# Patient Record
Sex: Male | Born: 1970
Health system: Southern US, Community
[De-identification: ages and names within clinical notes are randomized; demographics above are authoritative.]

## PROBLEM LIST (undated history)

## (undated) DIAGNOSIS — F32A Depression, unspecified: Secondary | ICD-10-CM

## (undated) DIAGNOSIS — B182 Chronic viral hepatitis C: Secondary | ICD-10-CM

## (undated) HISTORY — PX: BACK SURGERY: SHX140

## (undated) HISTORY — PX: BONY PELVIS SURGERY: SHX572

---

## 2010-06-10 ENCOUNTER — Emergency Department: Payer: Self-pay | Admitting: Emergency Medicine

## 2010-07-27 ENCOUNTER — Emergency Department: Payer: Self-pay | Admitting: Emergency Medicine

## 2017-10-08 ENCOUNTER — Ambulatory Visit
Admission: EM | Admit: 2017-10-08 | Discharge: 2017-10-08 | Disposition: A | Discharge status: Home / Self Care | Payer: Medicaid Other | Attending: Internal Medicine | Admitting: Internal Medicine

## 2017-10-08 ENCOUNTER — Other Ambulatory Visit: Payer: Self-pay

## 2017-10-08 ENCOUNTER — Ambulatory Visit: Payer: Medicaid Other

## 2017-10-08 ENCOUNTER — Encounter: Payer: Self-pay | Admitting: Gynecology

## 2017-10-08 DIAGNOSIS — S29011A Strain of muscle and tendon of front wall of thorax, initial encounter: Secondary | ICD-10-CM | POA: Insufficient documentation

## 2017-10-08 DIAGNOSIS — F17210 Nicotine dependence, cigarettes, uncomplicated: Secondary | ICD-10-CM | POA: Diagnosis not present

## 2017-10-08 DIAGNOSIS — Z8249 Family history of ischemic heart disease and other diseases of the circulatory system: Secondary | ICD-10-CM | POA: Diagnosis not present

## 2017-10-08 DIAGNOSIS — W51XXXA Accidental striking against or bumped into by another person, initial encounter: Secondary | ICD-10-CM | POA: Insufficient documentation

## 2017-10-08 DIAGNOSIS — Z881 Allergy status to other antibiotic agents status: Secondary | ICD-10-CM | POA: Diagnosis not present

## 2017-10-08 DIAGNOSIS — R0781 Pleurodynia: Secondary | ICD-10-CM | POA: Diagnosis present

## 2017-10-08 MED ORDER — SIMETHICONE 80 MG PO CHEW: 80.0000 mg | CHEWABLE_TABLET | Freq: Four times a day (QID) | ORAL | 0 refills | Status: DC | PRN

## 2017-10-08 MED ORDER — CYCLOBENZAPRINE HCL 10 MG PO TABS: 10.0000 mg | ORAL_TABLET | Freq: Two times a day (BID) | ORAL | 0 refills | Status: DC | PRN

## 2017-10-08 NOTE — ED Triage Notes (Signed)
Per patient was playing  With his son x yesterday when his son feet hit hm on his left side rib area.

## 2017-10-08 NOTE — ED Provider Notes (Signed)
[facility]    CSN: [id number] Arrival date & time: 10/08/17  4098     History   Chief Complaint Chief Complaint  Patient presents with  . Rib Injury    HPI KAIZEN IBSEN is a 47 y.o. male.   47 year old male who looks older than his stated age with history of chronic pain presents to urgent care complaining of left rib pain.  The patient states that his son jumped onto his chest yesterday and landed on his posterior chest lateral to his shoulder blade.  The patient felt exquisite pain immediately and states that it has been tough to move his arm and to cough due to pain since the injury.  He denies shortness of breath, nausea, vomiting or palpitations.  He states the pain radiates into his posterior neck.     History reviewed. No pertinent past medical history.  There are no active problems to display for this patient.   Past Surgical History:  Procedure Laterality Date  . BONY PELVIS SURGERY         Home Medications    Prior to Admission medications   Medication Sig Start Date End Date Taking? Authorizing Provider  acetaminophen (TYLENOL) 325 MG tablet Take by mouth.    [provider]  cyclobenzaprine (FLEXERIL) 10 MG tablet Take 1 tablet (10 mg total) by mouth 3 times/day as needed-between meals & bedtime for muscle spasms. 10/08/17   Arnaldo Natal, MD  simethicone (GAS-X) 80 MG chewable tablet Chew 1 tablet (80 mg total) by mouth every 6 (six) hours as needed for flatulence. 10/08/17   Arnaldo Natal, MD    Family History Family History  Problem Relation Age of Onset  . Hypertension Mother     Social History Social History   Tobacco Use  . Smoking status: Current Some Day Smoker    Packs/day: 1.00    Types: Cigarettes  . Smokeless tobacco: Never Used  Substance Use Topics  . Alcohol use: Never    Frequency: Never  . Drug use: Never     Allergies   Vancomycin   Review of Systems Review of Systems    Constitutional: Negative for chills and fever.  HENT: Negative for sore throat and tinnitus.   Eyes: Negative for redness.  Respiratory: Negative for cough and shortness of breath.   Cardiovascular: Positive for chest pain. Negative for palpitations.  Gastrointestinal: Negative for abdominal pain, diarrhea, nausea and vomiting.  Genitourinary: Negative for dysuria, frequency and urgency.  Musculoskeletal: Negative for myalgias.  Skin: Negative for rash.       No lesions  Neurological: Negative for weakness.  Hematological: Does not bruise/bleed easily.  Psychiatric/Behavioral: Negative for suicidal ideas.     Physical Exam Triage Vital Signs ED Triage Vitals  Enc Vitals Group     BP --      Pulse --      Resp --      Temp --      Temp src --      SpO2 --      Weight 10/08/17 0948 140 lb (63.5 kg)     Height 10/08/17 0948 5\' 10"  (1.778 m)     Head Circumference --      Peak Flow --      Pain Score 10/08/17 1134 0     Pain Loc --      Pain Edu? --      Excl. in GC? --    No data found.  Updated Vital Signs Ht 5\' 10"  (1.778 m)   Wt 140 lb (63.5 kg)   BMI 20.09 kg/m   Visual Acuity Right Eye Distance:   Left Eye Distance:   Bilateral Distance:    Right Eye Near:   Left Eye Near:    Bilateral Near:     Physical Exam  Constitutional: He is oriented to person, place, and time. He appears well-developed and well-nourished. No distress.  HENT:  Head: Normocephalic and atraumatic.  Mouth/Throat: Oropharynx is clear and moist.  Eyes: Pupils are equal, round, and reactive to light. Conjunctivae and EOM are normal. No scleral icterus.  Neck: Normal range of motion. Neck supple. No JVD present. No tracheal deviation present. No thyromegaly present.  Cardiovascular: Normal rate, regular rhythm and normal heart sounds. Exam reveals no gallop and no friction rub.  No murmur heard. Pulmonary/Chest: Effort normal and breath sounds normal. No respiratory distress.   Reproducible chest pain.  The patient is guarding as I touch his left ribs 3 through 5  Abdominal: Soft. Bowel sounds are normal. He exhibits no distension. There is no tenderness.  Musculoskeletal: Normal range of motion. He exhibits no edema.  Lymphadenopathy:    He has no cervical adenopathy.  Neurological: He is alert and oriented to person, place, and time. No cranial nerve deficit.  Skin: Skin is warm and dry. No rash noted. No erythema.  Psychiatric: He has a normal mood and affect. His behavior is normal. Judgment and thought content normal.     UC Treatments / Results  Labs (all labs ordered are listed, but only abnormal results are displayed) Labs Reviewed - No data to display  EKG None  Radiology Dg Ribs Unilateral W/chest Left  Result Date: 10/08/2017 CLINICAL DATA:  Left rib pain.  Kicked in left side. EXAM: LEFT RIBS AND CHEST - 3+ VIEW COMPARISON:  None. FINDINGS: No fracture or other bone lesions are seen involving the ribs. There is no evidence of pneumothorax or pleural effusion. Both lungs are clear. Heart size and mediastinal contours are within normal limits. IMPRESSION: Negative. Electronically Signed   By: Charlett NoseKevin  Dover M.D.   On: 10/08/2017 11:17    Procedures Procedures (including critical care time)  Medications Ordered in UC Medications - No data to display  Initial Impression / Assessment and Plan / UC Course  I have reviewed the triage vital signs and the nursing notes.  Pertinent labs & imaging results that were available during my care of the patient were reviewed by me and considered in my medical decision making (see chart for details).     Chest x-ray negative for rib fracture.  Pain is out of proportion with physical exam.  The patient has a history of management at pain clinic and has documented violation of his pain contract.  I have prescribed muscle relaxers for intercostal muscle strain.  The patient also has a lot of gas on physical exam  and endorses fluctuance as well as fullness of his abdomen particularly on the left side.  Recommended simethicone  Final Clinical Impressions(s) / UC Diagnoses   Final diagnoses:  Intercostal muscle strain, initial encounter   Discharge Instructions   None    ED Prescriptions    Medication Sig Dispense Auth. Provider   cyclobenzaprine (FLEXERIL) 10 MG tablet Take 1 tablet (10 mg total) by mouth 3 times/day as needed-between meals & bedtime for muscle spasms. 20 tablet Arnaldo Nataliamond, Vedika Dumlao S, MD   simethicone (GAS-X) 80 MG chewable tablet Chew 1  tablet (80 mg total) by mouth every 6 (six) hours as needed for flatulence. 30 tablet Arnaldo Natal, MD     Controlled Substance Prescriptions  [place] Controlled Substance Registry consulted? Not Applicable   Arnaldo Natal, MD 10/08/17 1229

## 2020-01-13 ENCOUNTER — Ambulatory Visit: Payer: Medicaid Other | Attending: Family Medicine | Admitting: Physical Therapy

## 2020-01-15 ENCOUNTER — Ambulatory Visit: Payer: Medicaid Other | Admitting: Physical Therapy

## 2020-01-20 ENCOUNTER — Ambulatory Visit: Payer: Medicaid Other | Admitting: Physical Therapy

## 2020-01-22 ENCOUNTER — Ambulatory Visit: Payer: Medicaid Other | Admitting: Physical Therapy

## 2020-01-25 IMAGING — CR DG RIBS W/ CHEST 3+V*L*
5 series · 5 of 5 positions shown · non-contrast
Comparison: None.

CLINICAL DATA: Left rib pain.  Kicked in left side.

EXAM:
LEFT RIBS AND CHEST - 3+ VIEW

[chest pa]
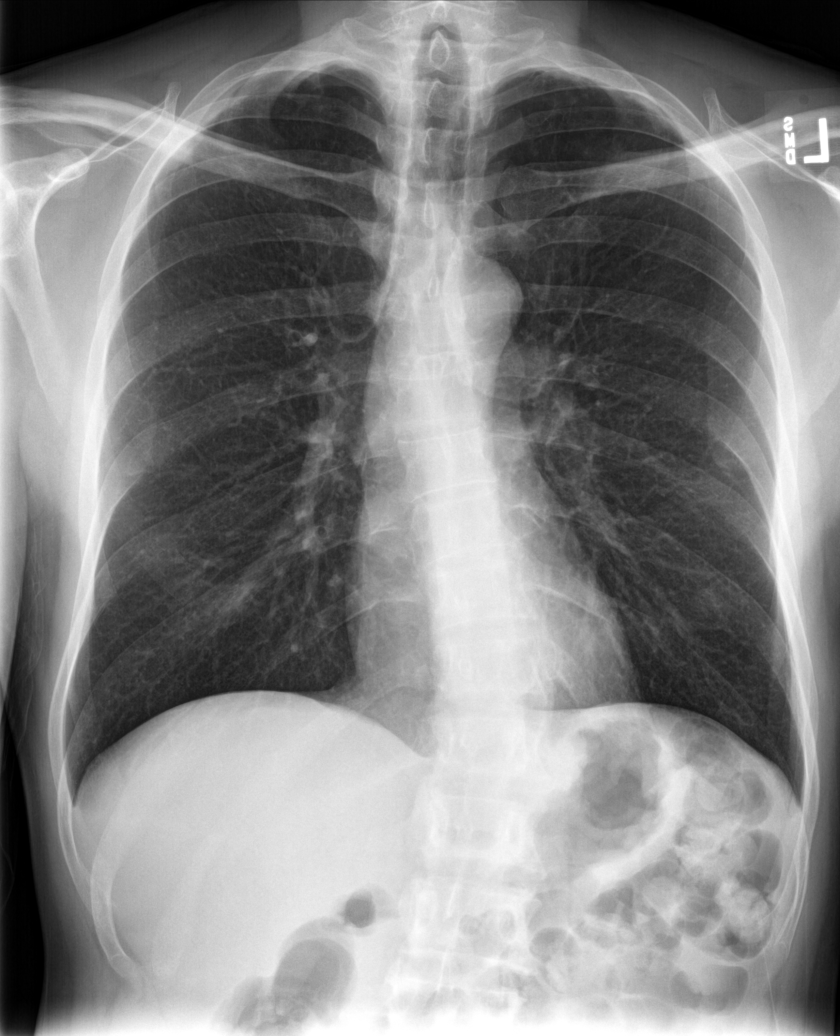

[rib pa (1 of 2)]
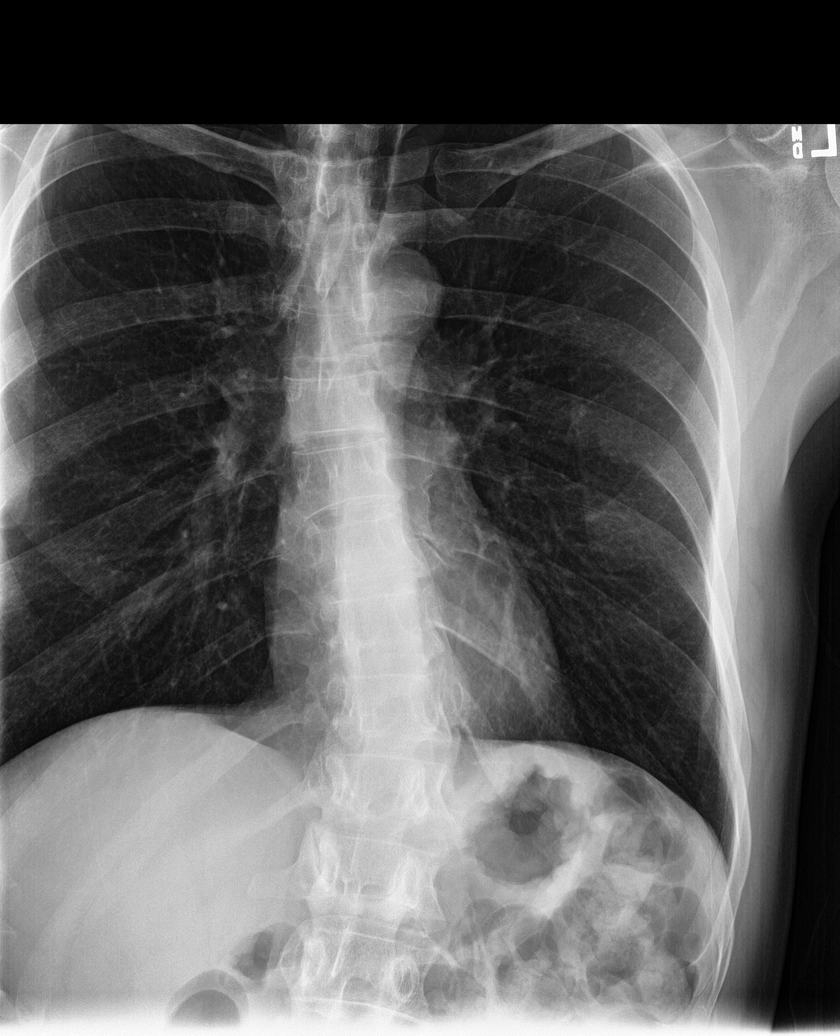

[rib pa (2 of 2)]
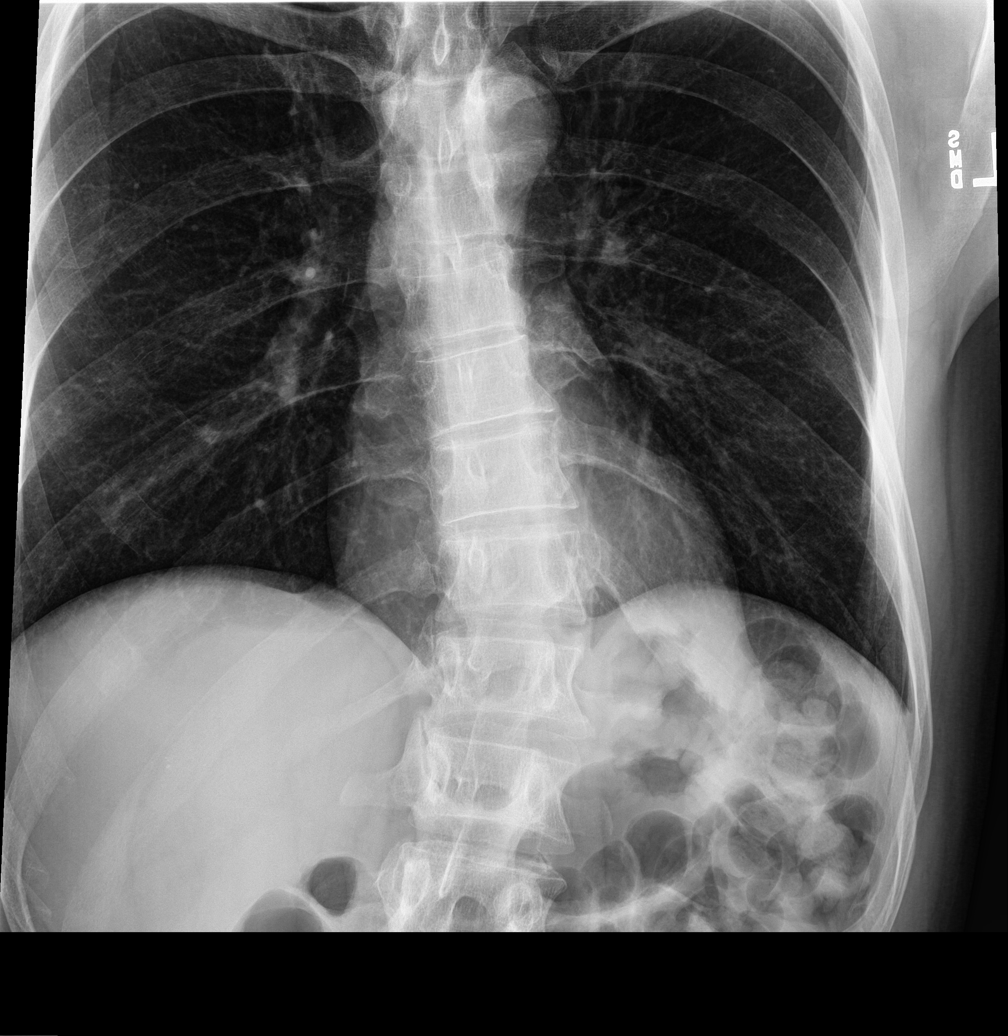

[rib obl (1 of 2)]
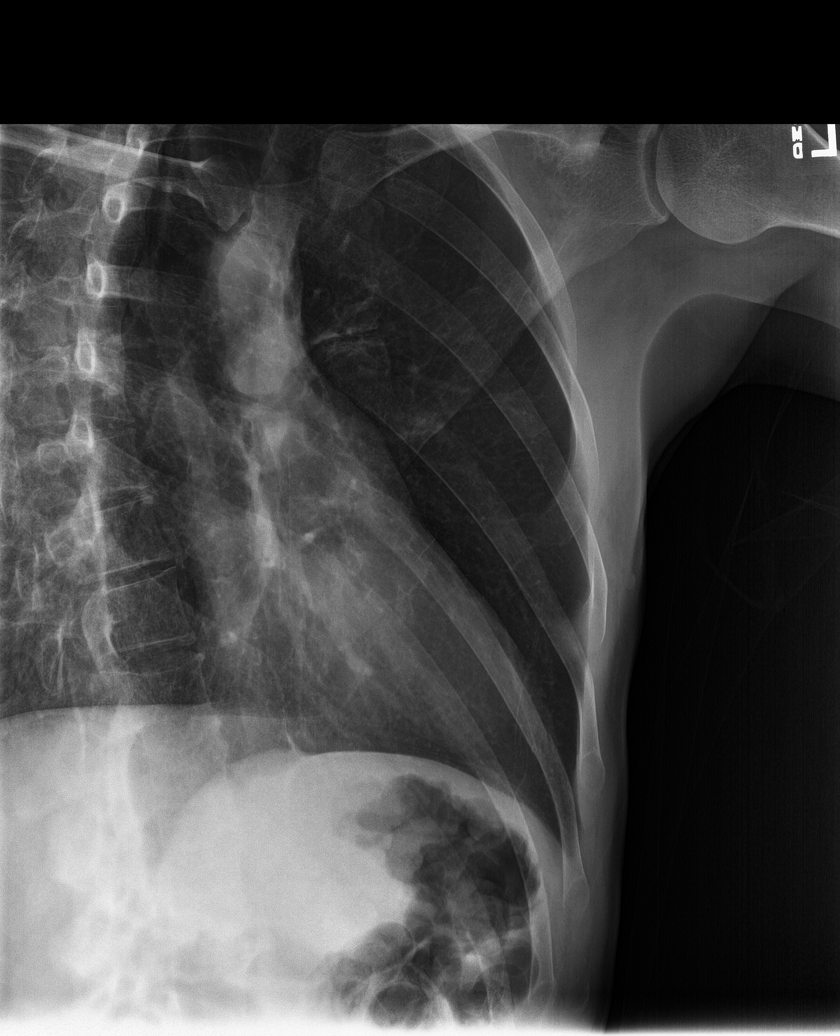

[rib obl (2 of 2)]
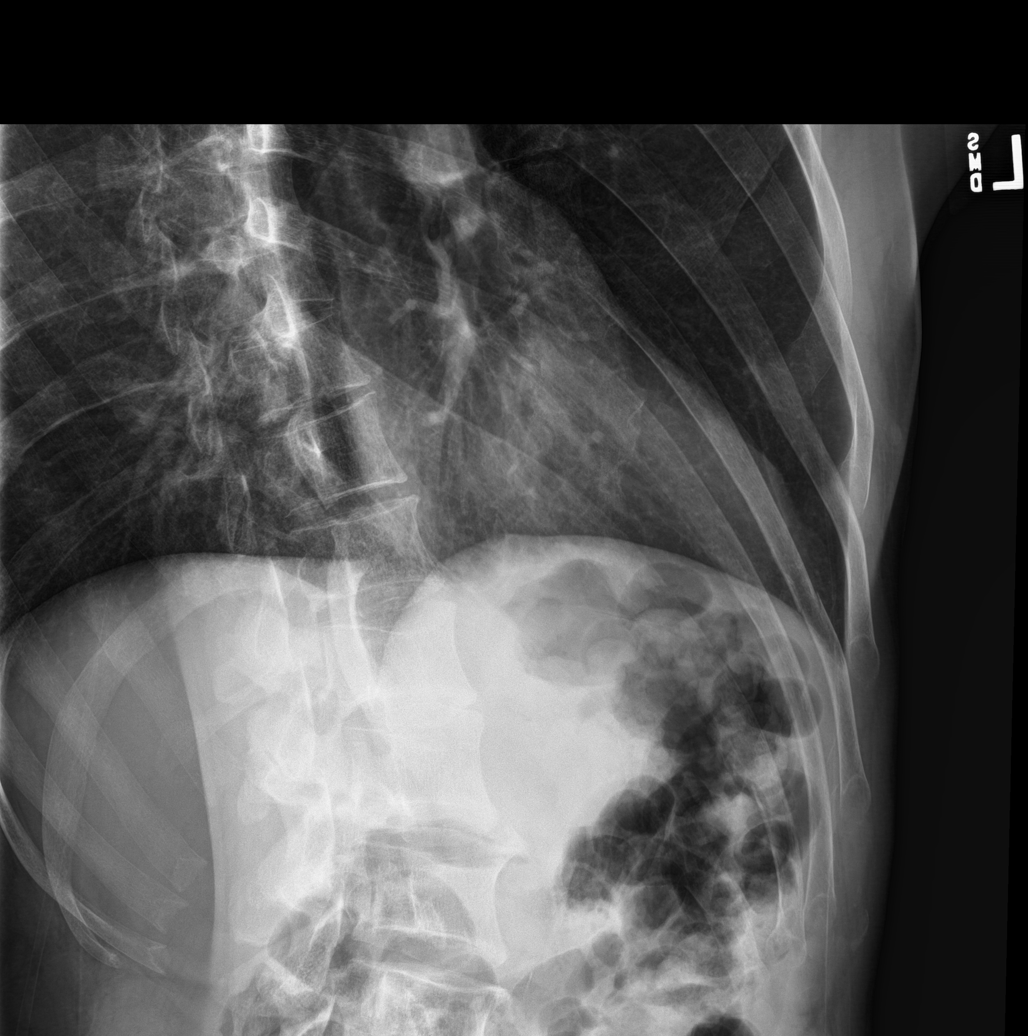

[5 of 5 positions shown; findings below may reference images not displayed]

FINDINGS: No fracture or other bone lesions are seen involving the ribs. There
is no evidence of pneumothorax or pleural effusion. Both lungs are
clear. Heart size and mediastinal contours are within normal limits.
IMPRESSION: Negative.

## 2020-01-27 ENCOUNTER — Ambulatory Visit: Payer: Medicaid Other | Admitting: Physical Therapy

## 2020-01-29 ENCOUNTER — Encounter: Payer: Medicaid Other | Admitting: Physical Therapy

## 2020-02-03 ENCOUNTER — Encounter: Payer: Medicaid Other | Admitting: Physical Therapy

## 2020-02-05 ENCOUNTER — Encounter: Payer: Medicaid Other | Admitting: Physical Therapy

## 2020-02-10 ENCOUNTER — Encounter: Payer: Medicaid Other | Admitting: Physical Therapy

## 2020-02-12 ENCOUNTER — Encounter: Payer: Medicaid Other | Admitting: Physical Therapy

## 2020-02-13 ENCOUNTER — Ambulatory Visit: Admit: 2020-02-13 | Discharge status: Absent without leave | Payer: Self-pay | Source: Home / Self Care

## 2020-02-13 ENCOUNTER — Ambulatory Visit
Admission: EM | Admit: 2020-02-13 | Discharge: 2020-02-13 | Disposition: A | Discharge status: Home / Self Care | Payer: Medicaid Other | Attending: Emergency Medicine | Admitting: Emergency Medicine

## 2020-02-13 ENCOUNTER — Other Ambulatory Visit: Payer: Self-pay

## 2020-02-13 DIAGNOSIS — G8929 Other chronic pain: Secondary | ICD-10-CM

## 2020-02-13 DIAGNOSIS — M5431 Sciatica, right side: Secondary | ICD-10-CM | POA: Diagnosis not present

## 2020-02-13 DIAGNOSIS — M545 Low back pain, unspecified: Secondary | ICD-10-CM

## 2020-02-13 DIAGNOSIS — M62830 Muscle spasm of back: Secondary | ICD-10-CM

## 2020-02-13 HISTORY — DX: Chronic viral hepatitis C: B18.2

## 2020-02-13 HISTORY — DX: Depression, unspecified: F32.A

## 2020-02-13 MED ORDER — TIZANIDINE HCL 4 MG PO TABS: 4.0000 mg | ORAL_TABLET | Freq: Three times a day (TID) | ORAL | 0 refills | Status: DC | PRN

## 2020-02-13 MED ORDER — IBUPROFEN 600 MG PO TABS: 600.0000 mg | ORAL_TABLET | Freq: Four times a day (QID) | ORAL | 0 refills | Status: DC | PRN

## 2020-02-13 MED ORDER — KETOROLAC TROMETHAMINE 60 MG/2ML IM SOLN
30.0000 mg | Freq: Once | INTRAMUSCULAR | Status: AC
  Administered 2020-02-13: 30 mg via INTRAMUSCULAR

## 2020-02-13 MED ORDER — LIDOCAINE 5 % EX PTCH: 1.0000 | MEDICATED_PATCH | CUTANEOUS | 0 refills | Status: DC

## 2020-02-13 NOTE — ED Provider Notes (Signed)
HPI  SUBJECTIVE:  Aaron Delacruz is a 49 y.o. male who presents with the acute onset of sharp, constant right-sided low back pain with radiation down his right buttock/posterior leg starting after he bent over yesterday to pick something up.  States that his "back went out".  He has had pain like this before and states that is typical of an acute exacerbation of his chronic pain.  He denies fevers, saddle esthesia, urinary fecal incontinence, urinary retention.  No change in his baseline leg weakness.  No bilateral radicular pain, abdominal pain, syncope.  He tried ice, heat, Flexeril, ibuprofen 400 mg 3 times daily without improvement of symptoms.  Symptoms are worse with lying down, movement, sitting for prolonged periods of time and with standing.  He has a past medical history chronic back pain status post pelvis fracture.  He is not on any chronic opiates.  He states that he received a steroid-based epidural injection 5 days ago that has been helping his pain.  He has active hepatitis C and is currently being treated for it.  No history of diabetes, hypertension, cancer, multiple myeloma, osteoporosis, prolonged steroid use, IV drug use, chronic kidney disease.  YSH:UOHFGB, [facility] Pain management: On [address] in [place]     Past Medical History:  Diagnosis Date  . Depression   . Hepatitis C, chronic (HCC)     Past Surgical History:  Procedure Laterality Date  . BACK SURGERY    . BONY PELVIS SURGERY      Family History  Problem Relation Age of Onset  . Hypertension Mother     Social History   Tobacco Use  . Smoking status: Current Some Day Smoker    Packs/day: 1.00    Types: Cigarettes  . Smokeless tobacco: Never Used  Substance Use Topics  . Alcohol use: Never  . Drug use: Never    No current facility-administered medications for this encounter.  Current Outpatient Medications:  .  amitriptyline (ELAVIL) 25 MG tablet, Take 25 mg by mouth at bedtime.,  Disp: , Rfl:  .  DULoxetine (CYMBALTA) 60 MG capsule, Take by mouth., Disp: , Rfl:  .  Glecaprevir-Pibrentasvir (MAVYRET) 100-40 MG TABS, Take by mouth., Disp: , Rfl:  .  rOPINIRole (REQUIP) 0.5 MG tablet, Take 0.5 mg by mouth 3 (three) times daily., Disp: , Rfl:  .  acetaminophen (TYLENOL) 325 MG tablet, Take by mouth., Disp: , Rfl:  .  ibuprofen (ADVIL) 600 MG tablet, Take 1 tablet (600 mg total) by mouth every 6 (six) hours as needed., Disp: 30 tablet, Rfl: 0 .  lidocaine (LIDODERM) 5 %, Place 1 patch onto the skin daily. Remove & Discard patch within 12 hours or as directed by MD, Disp: 30 patch, Rfl: 0 .  simethicone (GAS-X) 80 MG chewable tablet, Chew 1 tablet (80 mg total) by mouth every 6 (six) hours as needed for flatulence., Disp: 30 tablet, Rfl: 0 .  tiZANidine (ZANAFLEX) 4 MG tablet, Take 1 tablet (4 mg total) by mouth every 8 (eight) hours as needed for muscle spasms., Disp: 30 tablet, Rfl: 0  Allergies  Allergen Reactions  . Vancomycin Other (See Comments)    Patient received 2 doses of 1.5 g of vanc for infection, creatinine went up from 0.9 to 4.8 with no other appreciable etiology.   RENAL FAILURE Other reaction(s): Kidney Disorder Patient received 2 doses of 1.5 g of vanc for infection, creatinine went up from 0.9 to 4.8 with no other appreciable etiology.  Other reaction(s): Kidney Disorder Patient received 2 doses of 1.5 g of vanc for infection, creatinine went up from 0.9 to 4.8 with no other appreciable etiology.   . Trazodone Other (See Comments)     ROS  As noted in HPI.   Physical Exam  BP (!) 144/128 (BP Location: Left Arm)   Pulse (!) 104   Temp 98.6 F (37 C) (Oral)   Resp 19   SpO2 99%   Constitutional: Well developed, well nourished, appears very uncomfortable Eyes:  EOMI, conjunctiva normal bilaterally HENT: Normocephalic, atraumatic,mucus membranes moist Respiratory: Normal inspiratory effort Cardiovascular: Mild tachycardia GI:  nondistended. skin: No rash, skin intact Musculoskeletal: no CVAT. + Right-sided paralumbar tenderness, + muscle spasm. No bony tenderness. Bilateral lower extremities nontender, baseline ROM with intact DP pulses.  Pain with right hip flexion and extension against resistance, no pain with internal and external rotation.  Left hip normal.  SLR positive right side. Sensation baseline light touch bilaterally for Pt, DTR's symmetric and intact bilaterally KJ, Motor symmetric bilateral 5/5 hip flexion, quadriceps, hamstrings, EHL, foot dorsiflexion, foot plantarflexion, gait somewhat antalgic but without apparent new ataxia. Neurologic: Alert & oriented x 3, no focal neuro deficits Psychiatric: Speech and behavior appropriate   ED Course   Medications  ketorolac (TORADOL) injection 30 mg (30 mg Intramuscular Given 02/13/20 1533)    No orders of the defined types were placed in this encounter.   No results found for this or any previous visit (from the past 24 hour(s)). No results found.  ED Clinical Impression  1. Acute exacerbation of chronic low back pain   2. Sciatica of right side   3. Muscle spasm of back      ED Assessment/Plan  [place] Narcotic database reviewed for this patient.  Is currently getting Lyrica.  No opiate prescriptions recently.   No evidence of spinal cord involvement based on H&P. Pt describing an acute exacerbation of typical chronic back pain, has been < 6 week duration. No historical red flags as noted in HPI. No physical red flags such as fever, bony tenderness, lower extremity weakness, saddle anesthesia. Imaging not indicated at this time.  Doubt epidural abscess, as he has no midline tenderness.  All of his tenderness is in the paralumbar muscular region.  Giving Toradol 30 mg IM.  Sent home with ibuprofen 600 mg 3 times a day, discontinue Flexeril, start Zanaflex.  Warm compresses, lidocaine patches.  No steroids since he recently got a steroid-based epidural.   He will need to follow-up with his primary care physician or pain management.  Advised deep tissue massage.  To the ER if he gets worse.  Discussed  MDM, treatment plan, and plan for follow-up with patient. Discussed sn/sx that should prompt return to the ED. patient agrees with plan.   Meds ordered this encounter  Medications  . ketorolac (TORADOL) injection 30 mg  . ibuprofen (ADVIL) 600 MG tablet    Sig: Take 1 tablet (600 mg total) by mouth every 6 (six) hours as needed.    Dispense:  30 tablet    Refill:  0  . tiZANidine (ZANAFLEX) 4 MG tablet    Sig: Take 1 tablet (4 mg total) by mouth every 8 (eight) hours as needed for muscle spasms.    Dispense:  30 tablet    Refill:  0  . lidocaine (LIDODERM) 5 %    Sig: Place 1 patch onto the skin daily. Remove & Discard patch within 12 hours or as  directed by MD    Dispense:  30 patch    Refill:  0    *This clinic note was created using [employer]. Therefore, there may be occasional mistakes despite careful proofreading.  ?     Melynda Ripple, MD 02/13/20 [phone]

## 2020-02-13 NOTE — ED Triage Notes (Signed)
Pt c/o lower back pain onset yesterday after bending over to pick up an object. Pt states he has chronic back pain s/p fall approx 7 years ago when he shattered his pelvis and had to have stabilization to his spine. Pt states he went last week to pain magmt doctor and had epidural pain tx.   Denies numbness to feet/legs or other c/o.

## 2020-02-13 NOTE — Discharge Instructions (Addendum)
600 mg of ibuprofen 3 or 4 times a day.  Do this on a regular basis for the next several days.    Many people find gentle stretching and deep tissue massage helpful.  Stop the Flexeril, start Zanaflex.  Heat.  Try the lidocaine patch.  It may help.  Follow-up with your primary care physician or pain management in several days if this is not helping, go to the ER for the signs and symptoms we discussed.  I have decided to not put you on steroids because of your recent steroid epidural injection.  Go to www.goodrx.com to look up your medications. This will give you a list of where you can find your prescriptions at the most affordable prices. Or ask the pharmacist what the cash price is, or if they have any other discount programs available to help make your medication more affordable. This can be less expensive than what you would pay with insurance.

## 2020-02-17 ENCOUNTER — Encounter: Payer: Medicaid Other | Admitting: Physical Therapy

## 2020-02-19 ENCOUNTER — Encounter: Payer: Medicaid Other | Admitting: Physical Therapy

## 2020-02-24 ENCOUNTER — Encounter: Payer: Medicaid Other | Admitting: Physical Therapy

## 2020-08-03 ENCOUNTER — Emergency Department
Admission: EM | Admit: 2020-08-03 | Discharge: 2020-08-03 | Discharge status: Against Medical Advice | Payer: Medicaid Other | Attending: Emergency Medicine | Admitting: Emergency Medicine

## 2020-08-03 ENCOUNTER — Other Ambulatory Visit: Payer: Self-pay

## 2020-08-03 DIAGNOSIS — T510X1A Toxic effect of ethanol, accidental (unintentional), initial encounter: Secondary | ICD-10-CM | POA: Diagnosis not present

## 2020-08-03 DIAGNOSIS — T402X1A Poisoning by other opioids, accidental (unintentional), initial encounter: Secondary | ICD-10-CM | POA: Diagnosis not present

## 2020-08-03 DIAGNOSIS — X58XXXA Exposure to other specified factors, initial encounter: Secondary | ICD-10-CM | POA: Diagnosis not present

## 2020-08-03 DIAGNOSIS — F17210 Nicotine dependence, cigarettes, uncomplicated: Secondary | ICD-10-CM | POA: Insufficient documentation

## 2020-08-03 DIAGNOSIS — T50901A Poisoning by unspecified drugs, medicaments and biological substances, accidental (unintentional), initial encounter: Secondary | ICD-10-CM | POA: Diagnosis present

## 2020-08-03 LAB — ETHANOL: Alcohol, Ethyl (B): 30 mg/dL — ABNORMAL HIGH (ref <10)

## 2020-08-03 LAB — CBC WITH DIFFERENTIAL/PLATELET
Abs Immature Granulocytes: 0.04 10*3/uL (ref 0.00–0.07)
Basophils Absolute: 0 10*3/uL (ref 0.0–0.1)
Basophils Relative: 1 %
Eosinophils Absolute: 0.1 10*3/uL (ref 0.0–0.5)
Eosinophils Relative: 2 %
HCT: 41.8 % (ref 39.0–52.0)
Hemoglobin: 13.9 g/dL (ref 13.0–17.0)
Immature Granulocytes: 1 %
Lymphocytes Relative: 33 %
Lymphs Abs: 2.1 10*3/uL (ref 0.7–4.0)
MCH: 28.5 pg (ref 26.0–34.0)
MCHC: 33.3 g/dL (ref 30.0–36.0)
MCV: 85.7 fL (ref 80.0–100.0)
Monocytes Absolute: 0.4 10*3/uL (ref 0.1–1.0)
Monocytes Relative: 7 %
Neutro Abs: 3.8 10*3/uL (ref 1.7–7.7)
Neutrophils Relative %: 56 %
Platelets: 247 10*3/uL (ref 150–400)
RBC: 4.88 MIL/uL (ref 4.22–5.81)
RDW: 13.4 % (ref 11.5–15.5)
WBC: 6.6 10*3/uL (ref 4.0–10.5)
nRBC: 0 % (ref 0.0–0.2)

## 2020-08-03 LAB — SALICYLATE LEVEL: Salicylate Lvl: <7 mg/dL — ABNORMAL LOW (ref 7.0–30.0)

## 2020-08-03 LAB — COMPREHENSIVE METABOLIC PANEL
ALT: 11 U/L (ref 0–44)
AST: 17 U/L (ref 15–41)
Albumin: 4.3 g/dL (ref 3.5–5.0)
Alkaline Phosphatase: 71 U/L (ref 38–126)
Anion gap: 9 (ref 5–15)
BUN: 7 mg/dL (ref 6–20)
CO2: 26 mmol/L (ref 22–32)
Calcium: 8.9 mg/dL (ref 8.9–10.3)
Chloride: 105 mmol/L (ref 98–111)
Creatinine, Ser: 0.75 mg/dL (ref 0.61–1.24)
GFR, Estimated: >60 mL/min (ref >60)
Glucose, Bld: 86 mg/dL (ref 70–99)
Potassium: 4.3 mmol/L (ref 3.5–5.1)
Sodium: 140 mmol/L (ref 135–145)
Total Bilirubin: 0.3 mg/dL (ref 0.3–1.2)
Total Protein: 7.4 g/dL (ref 6.5–8.1)

## 2020-08-03 LAB — ACETAMINOPHEN LEVEL: Acetaminophen (Tylenol), Serum: <10 ug/mL — ABNORMAL LOW (ref 10–30)

## 2020-08-03 MED ORDER — NAPROXEN 500 MG PO TABS
500.0000 mg | ORAL_TABLET | Freq: Once | ORAL | Status: AC
  Administered 2020-08-03: 500 mg via ORAL
  Filled 2020-08-03: qty 1

## 2020-08-03 NOTE — ED Provider Notes (Signed)
[facility] Emergency Department Provider Note ____________________________________________   Event Date/Time   First MD Initiated Contact with Patient 08/03/20 1845     (approximate)  I have reviewed the triage vital signs and the nursing notes.  HISTORY  Chief Complaint Drug Overdose   HPI Aaron Delacruz is a 50 y.o. malewho presents to the ED for evaluation of possible overdose.  Chart review indicates history of depression and hep C.  Patient presents from home via EMS due to possible accidental overdose.  Patient reports he drank a sixpack of beer and had 1 Percocet this afternoon, then took a nap.  He reports being awoken by EMS.  Reports he may have smoked some cannabis, but denies additional coingestions.  Denies any intent at self-harm.  Denies any recent suicidal thoughts, homicidality or hallucinations.  Denies recent illnesses.  Reports he feels fine now is requesting some water.  We discussed blood work, EKG and p.o. challenge, he is agreeable.   Past Medical History:  Diagnosis Date  . Depression   . Hepatitis C, chronic (HCC)     There are no problems to display for this patient.   Past Surgical History:  Procedure Laterality Date  . BACK SURGERY    . BONY PELVIS SURGERY      Prior to Admission medications   Medication Sig Start Date End Date Taking? Authorizing Provider  acetaminophen (TYLENOL) 325 MG tablet Take by mouth.    [provider]  amitriptyline (ELAVIL) 25 MG tablet Take 25 mg by mouth at bedtime. 01/26/20   [provider]  DULoxetine (CYMBALTA) 60 MG capsule Take by mouth. 12/20/19 12/19/20  [provider]  Glecaprevir-Pibrentasvir (MAVYRET) 100-40 MG TABS Take by mouth. 12/25/19   [provider]  ibuprofen (ADVIL) 600 MG tablet Take 1 tablet (600 mg total) by mouth every 6 (six) hours as needed. 02/13/20   Domenick Gong, MD  lidocaine (LIDODERM) 5 % Place 1 patch onto the  skin daily. Remove & Discard patch within 12 hours or as directed by MD 02/13/20   Domenick Gong, MD  rOPINIRole (REQUIP) 0.5 MG tablet Take 0.5 mg by mouth 3 (three) times daily. 01/24/20   [provider]  simethicone (GAS-X) 80 MG chewable tablet Chew 1 tablet (80 mg total) by mouth every 6 (six) hours as needed for flatulence. 10/08/17   Arnaldo Natal, MD  tiZANidine (ZANAFLEX) 4 MG tablet Take 1 tablet (4 mg total) by mouth every 8 (eight) hours as needed for muscle spasms. 02/13/20   Domenick Gong, MD    Allergies Vancomycin and Trazodone  Family History  Problem Relation Age of Onset  . Hypertension Mother     Social History Social History   Tobacco Use  . Smoking status: Current Some Day Smoker    Packs/day: 1.00    Types: Cigarettes  . Smokeless tobacco: Never Used  Substance Use Topics  . Alcohol use: Never  . Drug use: Never    Review of Systems  Constitutional: No fever/chills Eyes: No visual changes. ENT: No sore throat. Cardiovascular: Denies chest pain. Respiratory: Denies shortness of breath. Gastrointestinal: No abdominal pain.  No nausea, no vomiting.  No diarrhea.  No constipation. Genitourinary: Negative for dysuria. Musculoskeletal: Positive for chronic lumbar pain at baseline. Skin: Negative for rash. Neurological: Negative for headaches, focal weakness or numbness.  ____________________________________________   PHYSICAL EXAM:  VITAL SIGNS: Vitals:   08/03/20 1849  BP: 131/63  Pulse: 90  Resp: 18  Temp: 98.2 F (36.8 C)  SpO2: 97%     Constitutional: Alert and oriented. Well appearing and in no acute distress. Eyes: Conjunctivae are normal. PERRL. EOMI. Head: Atraumatic. Nose: No congestion/rhinnorhea. Mouth/Throat: Mucous membranes are moist.  Oropharynx non-erythematous. Neck: No stridor. No cervical spine tenderness to palpation. Cardiovascular: Normal rate, regular rhythm. Grossly normal heart sounds.  Good  peripheral circulation. Respiratory: Normal respiratory effort.  No retractions. Lungs CTAB. Gastrointestinal: Soft , nondistended, nontender to palpation. No CVA tenderness. Musculoskeletal: No lower extremity tenderness nor edema.  No joint effusions. No signs of acute trauma. Neurologic:  Normal speech and language. No gross focal neurologic deficits are appreciated. No gait instability noted. Skin:  Skin is warm, dry and intact. No rash noted. Psychiatric: Mood and affect are normal. Speech and behavior are normal.  ____________________________________________   LABS (all labs ordered are listed, but only abnormal results are displayed)  Labs Reviewed  ETHANOL - Abnormal; Notable for the following components:      Result Value   Alcohol, Ethyl (B) 30 (*)    All other components within normal limits  ACETAMINOPHEN LEVEL - Abnormal; Notable for the following components:   Acetaminophen (Tylenol), Serum <10 (*)    All other components within normal limits  SALICYLATE LEVEL - Abnormal; Notable for the following components:   Salicylate Lvl <7.0 (*)    All other components within normal limits  COMPREHENSIVE METABOLIC PANEL  CBC WITH DIFFERENTIAL/PLATELET   ____________________________________________  12 Lead EKG  Sinus rhythm, rate of 94 bpm.  Stigmata of LVH.  Normal axis and intervals.  No evidence of acute ischemia. ____________________________________________   PROCEDURES and INTERVENTIONS  Procedure(s) performed (including Critical Care):  .1-3 Lead EKG Interpretation Performed by: Delton Prairie, MD Authorized by: Delton Prairie, MD     Interpretation: normal     ECG rate:  90   ECG rate assessment: normal     Rhythm: sinus rhythm     Ectopy: none     Conduction: normal      Medications  naproxen (NAPROSYN) tablet 500 mg (500 mg Oral Given 08/03/20 1937)    ____________________________________________   MDM / ED COURSE   50 year old male presents to the  ED after apparent accidental overdose when mixing Percocet and ethanol, ultimately leaving AMA from the ED prior to return of blood work.  Normal vitals on room air.  Exam is reassuring without evidence of neurologic or vascular deficits.  No distress or signs of trauma.  No significant myosis or evidence of opiate intoxication or overdose.  EKG is nonischemic without interval changes to suggest cardiac syncope.  Patient is initially agreeable to stay for work-up, but ultimately demands to leave AMA after blood work is drawn, or prior to the resulting.  He is ambulatory with a normal gait.  Patient signed out AMA.  Clinical Course as of 08/04/20 0016  Mon Aug 03, 2020  1945 Patient reports his ride is here and is demanding to leave.  He continues to deny suicidality.  We discussed my recommendations for blood work, but he refuses and indicates that he is leaving.  We discussed return precautions for the ED. [DS]    Clinical Course User Index [DS] Delton Prairie, MD    ____________________________________________   FINAL CLINICAL IMPRESSION(S) / ED DIAGNOSES  Final diagnoses:  Accidental drug overdose, initial encounter     ED Discharge Orders    None       Madisan Bice Katrinka Blazing   Note:  This document  was prepared using Conservation officer, historic buildings and may include unintentional dictation errors.   Delton Prairie, MD 08/04/20 [phone]

## 2020-08-03 NOTE — ED Triage Notes (Signed)
[facility] home with c/o accidental overdose. EMS states that they were notified by pt's significant other after pt took 3 percocet then became unresponsive. EMS states that pt woke up spontaneously once they arrived to residence. No narcan given. Pt A&Ox4 upon arrival to ED.

## 2020-08-03 NOTE — ED Notes (Signed)
Pt states that his ride is here to pick him up and is unable to stay for further testing and results. MD notified. Pt explained the possible consequences of leaving AMA. AMA form signed and placed in pt's chart.

## 2021-01-26 ENCOUNTER — Ambulatory Visit: Admit: 2021-01-26 | Payer: Self-pay

## 2021-01-26 ENCOUNTER — Ambulatory Visit
Admission: EM | Admit: 2021-01-26 | Discharge: 2021-01-26 | Disposition: A | Discharge status: Home / Self Care | Payer: Medicaid Other | Attending: Emergency Medicine | Admitting: Emergency Medicine

## 2021-01-26 ENCOUNTER — Other Ambulatory Visit: Payer: Self-pay

## 2021-01-26 DIAGNOSIS — L03119 Cellulitis of unspecified part of limb: Secondary | ICD-10-CM

## 2021-01-26 MED ORDER — DOXYCYCLINE HYCLATE 100 MG PO CAPS: 100.0000 mg | ORAL_CAPSULE | Freq: Two times a day (BID) | ORAL | 0 refills | Status: DC

## 2021-01-26 MED ORDER — OXYCODONE HCL 5 MG PO TABS: 5.0000 mg | ORAL_TABLET | Freq: Four times a day (QID) | ORAL | 0 refills | Status: DC | PRN

## 2021-01-26 NOTE — Discharge Instructions (Addendum)
Take the Doxycycline twice daily with food for 10 days.  Doxycycline will make you more sensitive to sunburn so wear sunscreen when outdoors and reapply it every 90 minutes.  Use OTC Tylenol and Ibuprofen according to the package instructions as needed for pain.  Use the oxycodone as needed for severe pain.  Return for new or worsening symptoms.

## 2021-01-26 NOTE — ED Triage Notes (Signed)
Pt c/o blisters, pain and swelling to both feet. Pt has small blister-like areas to the second toes, bilaterally. Pt also reports pain to his toenails on both feet. Pt states these symptoms have increasing over the past few months.

## 2021-01-26 NOTE — ED Provider Notes (Signed)
[facility]    CSN: [id number] Arrival date & time: 01/26/21  1741      History   Chief Complaint Chief Complaint  Patient presents with   Foot Pain    bilateral    HPI Aaron Delacruz is a 50 y.o. male.   HPI  50 year old male here for evaluation of bilateral foot pain.  Patient reports that he has been experiencing brittle toenails for the past 6 months or more.  This is associated with redness and swelling to the cuticles of all of his toes as well as blisters on the second toe of both feet.  These do occasionally drain a yellow fluid but they are not draining at present.  Patient's wife is concerned because when she got home from work 2 days ago his right foot was swollen, hot, and red.  She tried to get the patient to come to the urgent care at that time but he refused.  The redness and swelling has improved but the patient still bearing some pain in both feet.  He has not had a fever.  Patient was recently treated for hepatitis C but has completed that treatment.  He is not taking any of his medications at present.  He states he has difficulty getting to the doctor.  Past Medical History:  Diagnosis Date   Depression    Hepatitis C, chronic (HCC)     There are no problems to display for this patient.   Past Surgical History:  Procedure Laterality Date   BACK SURGERY     BONY PELVIS SURGERY         Home Medications    Prior to Admission medications   Medication Sig Start Date End Date Taking? Authorizing Provider  doxycycline (VIBRAMYCIN) 100 MG capsule Take 1 capsule (100 mg total) by mouth 2 (two) times daily. 01/26/21  Yes Becky Augusta, NP  oxyCODONE (ROXICODONE) 5 MG immediate release tablet Take 1 tablet (5 mg total) by mouth every 6 (six) hours as needed for severe pain. 01/26/21  Yes Becky Augusta, NP  acetaminophen (TYLENOL) 325 MG tablet Take by mouth.    [provider]  ibuprofen (ADVIL) 600 MG tablet Take 1 tablet (600 mg total)  by mouth every 6 (six) hours as needed. 02/13/20   Domenick Gong, MD  lidocaine (LIDODERM) 5 % Place 1 patch onto the skin daily. Remove & Discard patch within 12 hours or as directed by MD 02/13/20   Domenick Gong, MD  simethicone (GAS-X) 80 MG chewable tablet Chew 1 tablet (80 mg total) by mouth every 6 (six) hours as needed for flatulence. 10/08/17   Arnaldo Natal, MD    Family History Family History  Problem Relation Age of Onset   Hypertension Mother     Social History Social History   Tobacco Use   Smoking status: Some Days    Packs/day: 1.00    Types: Cigarettes   Smokeless tobacco: Never  Vaping Use   Vaping Use: Never used  Substance Use Topics   Alcohol use: Never   Drug use: Never     Allergies   Vancomycin and Trazodone   Review of Systems Review of Systems  Constitutional:  Negative for fever.  Skin:  Positive for color change and wound.  Hematological: Negative.   Psychiatric/Behavioral:  Positive for agitation. The patient is nervous/anxious.     Physical Exam Triage Vital Signs ED Triage Vitals  Enc Vitals Group     BP 01/26/21  1908 (!) 165/103     Pulse Rate 01/26/21 1908 94     Resp 01/26/21 1908 18     Temp 01/26/21 1908 98.8 F (37.1 C)     Temp Source 01/26/21 1908 Oral     SpO2 01/26/21 1908 99 %     Weight 01/26/21 1907 150 lb (68 kg)     Height 01/26/21 1907 5\' 9"  (1.753 m)     Head Circumference --      Peak Flow --      Pain Score 01/26/21 1906 8     Pain Loc --      Pain Edu? --      Excl. in GC? --    No data found.  Updated Vital Signs BP (!) 165/103 (BP Location: Left Arm)   Pulse 94   Temp 98.8 F (37.1 C) (Oral)   Resp 18   Ht 5\' 9"  (1.753 m)   Wt 150 lb (68 kg)   SpO2 99%   BMI 22.15 kg/m   Visual Acuity Right Eye Distance:   Left Eye Distance:   Bilateral Distance:    Right Eye Near:   Left Eye Near:    Bilateral Near:     Physical Exam Vitals and nursing note reviewed.  Constitutional:       Appearance: Normal appearance.  HENT:     Head: Normocephalic and atraumatic.  Musculoskeletal:        General: Tenderness present. No swelling.  Skin:    General: Skin is warm and dry.     Capillary Refill: Capillary refill takes less than 2 seconds.     Findings: Erythema and lesion present. No bruising.  Neurological:     General: No focal deficit present.     Mental Status: He is alert and oriented to person, place, and time.  Psychiatric:        Mood and Affect: Mood normal.        Behavior: Behavior normal.        Thought Content: Thought content normal.        Judgment: Judgment normal.     UC Treatments / Results  Labs (all labs ordered are listed, but only abnormal results are displayed) Labs Reviewed - No data to display  EKG   Radiology No results found.  Procedures Procedures (including critical care time)  Medications Ordered in UC Medications - No data to display  Initial Impression / Assessment and Plan / UC Course  I have reviewed the triage vital signs and the nursing notes.  Pertinent labs & imaging results that were available during my care of the patient were reviewed by me and considered in my medical decision making (see chart for details).  Patient is a very anxious 50 year old male here for evaluation of pain in both feet that has been going on for a number of months and became worse 2 days ago.  Patient seems very anxious and he has difficulty sitting still in the exam room.  Both of his feet seem to have been bothering him for a period of time and he has to scabbed sores on the tops of the IP joint of the second toes on both feet.  There is no drainage at present but the surrounding tissue is erythematous and tender to touch.  Both of his great toes have erythema around the cuticle of the toenail without fluctuance or induration.  All of his toenails are yellow and brittle.  The toenails of both  big toes have flaked off almost back to the  cuticle.  DP and PT pulses are 2+ in both feet.  The dorsum of the right foot is hot, mildly edematous, and erythematous.  Patient's physical exam is consistent with cellulitis of both feet.  We will treat patient with doxycycline twice daily for 10 days for the cellulitis.  Patient is complaining of marked pain bordering on inability to walk.  Will give patient oxycodone 5 mg tablets for severe pain and have him use over-the-counter ibuprofen as needed for mild to moderate pain.  Patient also has severe fungal infections of all of his toes of both feet.  He does have an appointment with the podiatrist scheduled.  Advised patient that he needs to have baseline blood work to make sure that his liver function has returned to normal following his hepatitis C treatment prior to embarking on therapy for his toenail fungus.   Final Clinical Impressions(s) / UC Diagnoses   Final diagnoses:  Cellulitis of lower extremity, unspecified laterality     Discharge Instructions      Take the Doxycycline twice daily with food for 10 days.  Doxycycline will make you more sensitive to sunburn so wear sunscreen when outdoors and reapply it every 90 minutes.  Use OTC Tylenol and Ibuprofen according to the package instructions as needed for pain.  Use the oxycodone as needed for severe pain.  Return for new or worsening symptoms.       ED Prescriptions     Medication Sig Dispense Auth. Provider   doxycycline (VIBRAMYCIN) 100 MG capsule Take 1 capsule (100 mg total) by mouth 2 (two) times daily. 20 capsule Becky Augusta, NP   oxyCODONE (ROXICODONE) 5 MG immediate release tablet Take 1 tablet (5 mg total) by mouth every 6 (six) hours as needed for severe pain. 20 tablet Becky Augusta, NP      I have reviewed the PDMP during this encounter.   Becky Augusta, NP 01/26/21 1927

## 2021-04-22 ENCOUNTER — Ambulatory Visit: Admit: 2021-04-22 | Payer: Self-pay

## 2021-04-22 ENCOUNTER — Ambulatory Visit
Admission: EM | Admit: 2021-04-22 | Discharge: 2021-04-22 | Disposition: A | Discharge status: Home / Self Care | Payer: Medicaid Other | Attending: Emergency Medicine | Admitting: Emergency Medicine

## 2021-04-22 ENCOUNTER — Other Ambulatory Visit: Payer: Self-pay

## 2021-04-22 DIAGNOSIS — L739 Follicular disorder, unspecified: Secondary | ICD-10-CM | POA: Diagnosis not present

## 2021-04-22 MED ORDER — IBUPROFEN 800 MG PO TABS: 800.0000 mg | ORAL_TABLET | Freq: Three times a day (TID) | ORAL | 0 refills | Status: AC

## 2021-04-22 MED ORDER — DOXYCYCLINE HYCLATE 100 MG PO CAPS: 100.0000 mg | ORAL_CAPSULE | Freq: Two times a day (BID) | ORAL | 0 refills | Status: AC

## 2021-04-22 NOTE — Discharge Instructions (Addendum)
Take doxycycline twice a day for 10 days  Folliculitis is inflammation of the hair follicles. Folliculitis most commonly occurs on the scalp, thighs, legs, back, and buttocks. However, it can occur anywhere on the body.  Hold warm-hot compresses to affected area at least 4 times a day, this helps to facilitate draining, the more the better  Please return for evaluation for increased swelling, increased tenderness or pain, non healing site, non draining site, you begin to have fever or chills

## 2021-04-22 NOTE — ED Provider Notes (Addendum)
[facility]    CSN: [id number] Arrival date & time: 04/22/21  0915      History   Chief Complaint Chief Complaint  Patient presents with   Abscess   Mass    HPI Aaron Delacruz is a 50 y.o. male.   Patient presents with lesions to the scalp occurring intermittently for 2 weeks.  Endorses that the lesion will appear, increase in size and pressure, drainage and resolve before a new lesion will appear in 1 to 2 days .has been attempting warm compresses which have allowed lesions to come together to drain.  Symptoms started abruptly with no precipitating event.  Denies changes in diet, recent travel, cosmetic products, new medications, fever, chills.  History of chronic hepatitis C.  Past Medical History:  Diagnosis Date   Depression    Hepatitis C, chronic (HCC)     There are no problems to display for this patient.   Past Surgical History:  Procedure Laterality Date   BACK SURGERY     BONY PELVIS SURGERY         Home Medications    Prior to Admission medications   Medication Sig Start Date End Date Taking? Authorizing Provider  acetaminophen (TYLENOL) 325 MG tablet Take by mouth.   Yes [provider]  ibuprofen (ADVIL) 600 MG tablet Take 1 tablet (600 mg total) by mouth every 6 (six) hours as needed. 02/13/20  Yes Domenick Gong, MD  oxyCODONE (ROXICODONE) 5 MG immediate release tablet Take 1 tablet (5 mg total) by mouth every 6 (six) hours as needed for severe pain. 01/26/21  Yes Becky Augusta, NP  doxycycline (VIBRAMYCIN) 100 MG capsule Take 1 capsule (100 mg total) by mouth 2 (two) times daily. 01/26/21   Becky Augusta, NP  lidocaine (LIDODERM) 5 % Place 1 patch onto the skin daily. Remove & Discard patch within 12 hours or as directed by MD 02/13/20   Domenick Gong, MD  simethicone (GAS-X) 80 MG chewable tablet Chew 1 tablet (80 mg total) by mouth every 6 (six) hours as needed for flatulence. 10/08/17   Arnaldo Natal, MD    Family  History Family History  Problem Relation Age of Onset   Hypertension Mother     Social History Social History   Tobacco Use   Smoking status: Some Days    Packs/day: 1.00    Types: Cigarettes   Smokeless tobacco: Never  Vaping Use   Vaping Use: Never used  Substance Use Topics   Alcohol use: Never   Drug use: Never     Allergies   Vancomycin and Trazodone   Review of Systems Review of Systems  Constitutional: Negative.   Respiratory: Negative.    Cardiovascular: Negative.   Skin:  Positive for wound. Negative for color change, pallor and rash.  Neurological: Negative.     Physical Exam Triage Vital Signs ED Triage Vitals  Enc Vitals Group     BP 04/22/21 1012 (!) 157/89     Pulse Rate 04/22/21 1012 84     Resp 04/22/21 1012 18     Temp 04/22/21 1012 98.3 F (36.8 C)     Temp Source 04/22/21 1012 Oral     SpO2 04/22/21 1012 100 %     Weight 04/22/21 1010 135 lb (61.2 kg)     Height 04/22/21 1010 5\' 9"  (1.753 m)     Head Circumference --      Peak Flow --      Pain Score  04/22/21 1009 6     Pain Loc --      Pain Edu? --      Excl. in GC? --    No data found.  Updated Vital Signs BP (!) 157/89 (BP Location: Left Arm)    Pulse 84    Temp 98.3 F (36.8 C) (Oral)    Resp 18    Ht 5\' 9"  (1.753 m)    Wt 135 lb (61.2 kg)    SpO2 100%    BMI 19.94 kg/m   Visual Acuity Right Eye Distance:   Left Eye Distance:   Bilateral Distance:    Right Eye Near:   Left Eye Near:    Bilateral Near:     Physical Exam Constitutional:      Appearance: Normal appearance. He is normal weight.  HENT:     Head: Normocephalic.      Comments: 0.5 x 0.5 cm immature abscess left lower posterior head  2x3 cm abscess present right lower posterior head  1x1 cm abscess present on center of scalp  Eyes:     Extraocular Movements: Extraocular movements intact.  Pulmonary:     Effort: Pulmonary effort is normal.  Skin:    Comments: 3 abscesses present diffusely across  scalp with diffuse erythema and swelling to the hair follicle, abscesses are tender to touch, nondraining  Neurological:     Mental Status: He is alert and oriented to person, place, and time. Mental status is at baseline.  Psychiatric:        Mood and Affect: Mood normal.        Behavior: Behavior normal.     UC Treatments / Results  Labs (all labs ordered are listed, but only abnormal results are displayed) Labs Reviewed - No data to display  EKG   Radiology No results found.  Procedures Procedures (including critical care time)  Medications Ordered in UC Medications - No data to display  Initial Impression / Assessment and Plan / UC Course  I have reviewed the triage vital signs and the nursing notes.  Pertinent labs & imaging results that were available during my care of the patient were reviewed by me and considered in my medical decision making (see chart for details).  Folliculitis  Doxycycline 100 mg twice daily for 10 days, will extend course due to extent of inflammation and abscesses present, advised continued use of warm compresses , recommended PCP of dermatology follow up for persistent symptoms   Final Clinical Impressions(s) / UC Diagnoses   Final diagnoses:  None   Discharge Instructions   None    ED Prescriptions   None    PDMP not reviewed this encounter.   , NP 04/22/21 1039    04/24/21 R, NP 04/22/21 1041

## 2021-04-22 NOTE — ED Triage Notes (Signed)
Pt c/o "knots and lumps" along top of head and back of the neck. Pt states that the bumps along his head pop and have now scabbed over. Pt states that they are tight and painful and are tender to touch. Pt states they have been present for the last 2 weeks.

## 2021-05-06 ENCOUNTER — Telehealth: Payer: Self-pay

## 2021-05-06 NOTE — Telephone Encounter (Signed)
Patient is seeing [facility]

## 2023-01-17 ENCOUNTER — Ambulatory Visit: Payer: Medicaid Other | Admitting: Podiatry

## 2023-01-17 ENCOUNTER — Encounter: Payer: Self-pay | Admitting: Podiatry

## 2023-01-17 VITALS — BP 130/81 | HR 68

## 2023-01-17 DIAGNOSIS — I739 Peripheral vascular disease, unspecified: Secondary | ICD-10-CM

## 2023-01-17 NOTE — Progress Notes (Signed)
   Chief Complaint  Patient presents with   Nail Problem    "My toenails get infected.  I can hardly touch them." N - painful toenails L - 1-5 bilateral D - 2-3 years O - gradually worse C - yellow, ache, brittle, thick A - touch, shoes, standing at work T - none    HPI: 52 y.o. male PMHx smoker 1ppd x 37 years, chronic hepatitis C presenting today as a new patient for evaluation of severe pain and tenderness associated to the lesser digits of the bilateral toes.  Onset for several years.  He has not seen a physician for this condition.  He has not done anything for treatment.  He says that he has severe pain and tenderness almost on a daily basis exacerbated by elevating his feet, sleeping at night, and close toed shoes.  Past Medical History:  Diagnosis Date   Depression    Hepatitis C, chronic (HCC)     Past Surgical History:  Procedure Laterality Date   BACK SURGERY     BONY PELVIS SURGERY      Allergies  Allergen Reactions   Vancomycin Other (See Comments)    Patient received 2 doses of 1.5 g of vanc for infection, creatinine went up from 0.9 to 4.8 with no other appreciable etiology.   RENAL FAILURE Other reaction(s): Kidney Disorder Patient received 2 doses of 1.5 g of vanc for infection, creatinine went up from 0.9 to 4.8 with no other appreciable etiology.    Other reaction(s): Kidney Disorder Patient received 2 doses of 1.5 g of vanc for infection, creatinine went up from 0.9 to 4.8 with no other appreciable etiology.     Trazodone Other (See Comments)     Physical Exam: General: The patient is alert and oriented x3 in no acute distress.  Dermatology: Skin is warm, dry and supple bilateral lower extremities.   Vascular: Pulses diminished.  Unable to palpate the toes to assess for capillary refill due to severe pain.  Right no appreciable edema.  No erythema.  Neurological: Grossly intact via light touch  Musculoskeletal Exam: Hammertoe deformity noted  lesser digits.  Severe pain and tenderness with light touch to the lesser digits bilateral  Assessment/Plan of Care: 1.  Concern for bilateral lower extremity limb ischemia  -Patient has severe pain and tenderness out of proportion to the presentation of the toenails.  They are painful even with light touch.  Worse at night when he goes to bed and with elevation.  Also worse with close toed shoes. -Concern for possible limb ischemia and ischemic pain to the toes.  Patient is a 37 pack year smoker. (1ppd x 37 yrs) -Order placed for ABIs bilateral lower extremities -Will plan to contact the patient via telephone with the results.  Recommend vascular consult if abnormal        Felecia Shelling, DPM [facility]  Dr. Felecia Shelling, DPM    [address] [place], [place] [address]                Office [phone]  Fax [phone]

## 2023-01-23 ENCOUNTER — Ambulatory Visit (INDEPENDENT_AMBULATORY_CARE_PROVIDER_SITE_OTHER): Payer: Medicaid Other

## 2023-01-23 DIAGNOSIS — I739 Peripheral vascular disease, unspecified: Secondary | ICD-10-CM | POA: Diagnosis not present

## 2023-02-09 LAB — VAS US ABI WITH/WO TBI
Left ABI: 1.15
Right ABI: 1.15

## 2023-02-15 NOTE — Progress Notes (Signed)
Tried calling patient. No answer. Left voicemail to contact our office if he would like to schedule his follow-up appointment or has additional questions.-Dr. Logan Bores
